# Patient Record
Sex: Female | Born: 1960 | Race: White | Hispanic: No | Smoking: Never smoker
Health system: Southern US, Community
[De-identification: ages and names within clinical notes are randomized; demographics above are authoritative.]

## PROBLEM LIST (undated history)

## (undated) HISTORY — PX: COLONOSCOPY: SHX174

---

## 1998-05-07 ENCOUNTER — Other Ambulatory Visit: Admission: RE | Admit: 1998-05-07 | Discharge: 1998-05-07 | Payer: Self-pay | Admitting: Obstetrics and Gynecology

## 1999-05-03 ENCOUNTER — Encounter (INDEPENDENT_AMBULATORY_CARE_PROVIDER_SITE_OTHER): Payer: Self-pay | Admitting: Specialist

## 1999-05-03 ENCOUNTER — Other Ambulatory Visit: Admission: RE | Admit: 1999-05-03 | Discharge: 1999-05-03 | Payer: Self-pay | Admitting: Otolaryngology

## 1999-12-26 ENCOUNTER — Other Ambulatory Visit: Admission: RE | Admit: 1999-12-26 | Discharge: 1999-12-26 | Payer: Self-pay | Admitting: Obstetrics and Gynecology

## 2001-07-30 ENCOUNTER — Other Ambulatory Visit: Admission: RE | Admit: 2001-07-30 | Discharge: 2001-07-30 | Payer: Self-pay | Admitting: Obstetrics and Gynecology

## 2002-04-04 ENCOUNTER — Other Ambulatory Visit: Admission: RE | Admit: 2002-04-04 | Discharge: 2002-04-04 | Payer: Self-pay | Admitting: Obstetrics and Gynecology

## 2002-09-15 ENCOUNTER — Other Ambulatory Visit: Admission: RE | Admit: 2002-09-15 | Discharge: 2002-09-15 | Payer: Self-pay | Admitting: Obstetrics and Gynecology

## 2002-10-14 ENCOUNTER — Encounter: Admission: RE | Admit: 2002-10-14 | Discharge: 2002-10-14 | Payer: Self-pay | Admitting: Internal Medicine

## 2002-10-14 ENCOUNTER — Encounter: Payer: Self-pay | Admitting: Internal Medicine

## 2003-06-02 ENCOUNTER — Other Ambulatory Visit: Admission: RE | Admit: 2003-06-02 | Discharge: 2003-06-02 | Payer: Self-pay | Admitting: Obstetrics and Gynecology

## 2004-09-02 ENCOUNTER — Other Ambulatory Visit: Admission: RE | Admit: 2004-09-02 | Discharge: 2004-09-02 | Payer: Self-pay | Admitting: Obstetrics and Gynecology

## 2004-10-10 ENCOUNTER — Ambulatory Visit: Payer: Self-pay | Admitting: Internal Medicine

## 2004-12-25 ENCOUNTER — Ambulatory Visit: Payer: Self-pay | Admitting: Internal Medicine

## 2005-08-24 ENCOUNTER — Emergency Department (HOSPITAL_COMMUNITY): Admission: EM | Admit: 2005-08-24 | Discharge: 2005-08-24 | Payer: Self-pay | Admitting: Emergency Medicine

## 2005-10-10 ENCOUNTER — Other Ambulatory Visit: Admission: RE | Admit: 2005-10-10 | Discharge: 2005-10-10 | Payer: Self-pay | Admitting: Obstetrics and Gynecology

## 2006-09-16 ENCOUNTER — Encounter: Admission: RE | Admit: 2006-09-16 | Discharge: 2006-09-16 | Payer: Self-pay | Admitting: Obstetrics and Gynecology

## 2007-12-18 ENCOUNTER — Ambulatory Visit: Payer: Self-pay | Admitting: Family Medicine

## 2007-12-18 DIAGNOSIS — L255 Unspecified contact dermatitis due to plants, except food: Secondary | ICD-10-CM

## 2007-12-22 ENCOUNTER — Telehealth (INDEPENDENT_AMBULATORY_CARE_PROVIDER_SITE_OTHER): Payer: Self-pay | Admitting: *Deleted

## 2008-01-17 ENCOUNTER — Encounter: Admission: RE | Admit: 2008-01-17 | Discharge: 2008-01-17 | Payer: Self-pay | Admitting: Obstetrics and Gynecology

## 2008-05-19 ENCOUNTER — Ambulatory Visit: Payer: Self-pay | Admitting: Family Medicine

## 2008-05-19 DIAGNOSIS — J069 Acute upper respiratory infection, unspecified: Secondary | ICD-10-CM | POA: Insufficient documentation

## 2008-05-29 ENCOUNTER — Telehealth (INDEPENDENT_AMBULATORY_CARE_PROVIDER_SITE_OTHER): Payer: Self-pay | Admitting: *Deleted

## 2008-05-30 ENCOUNTER — Telehealth (INDEPENDENT_AMBULATORY_CARE_PROVIDER_SITE_OTHER): Payer: Self-pay | Admitting: *Deleted

## 2008-12-20 ENCOUNTER — Ambulatory Visit: Payer: Self-pay | Admitting: Family Medicine

## 2009-01-22 ENCOUNTER — Encounter: Admission: RE | Admit: 2009-01-22 | Discharge: 2009-01-22 | Payer: Self-pay | Admitting: Obstetrics and Gynecology

## 2009-01-24 ENCOUNTER — Telehealth (INDEPENDENT_AMBULATORY_CARE_PROVIDER_SITE_OTHER): Payer: Self-pay | Admitting: *Deleted

## 2009-05-21 ENCOUNTER — Ambulatory Visit: Payer: Self-pay | Admitting: Internal Medicine

## 2009-05-21 DIAGNOSIS — N390 Urinary tract infection, site not specified: Secondary | ICD-10-CM

## 2009-05-21 DIAGNOSIS — R300 Dysuria: Secondary | ICD-10-CM | POA: Insufficient documentation

## 2009-05-21 LAB — CONVERTED CEMR LAB
Bilirubin Urine: NEGATIVE
Ketones, urine, test strip: NEGATIVE
Specific Gravity, Urine: 1.01
pH: 7

## 2009-05-22 ENCOUNTER — Encounter: Payer: Self-pay | Admitting: Internal Medicine

## 2009-05-22 LAB — CONVERTED CEMR LAB: Bacteria, UA: NONE SEEN

## 2009-11-20 ENCOUNTER — Ambulatory Visit: Payer: Self-pay | Admitting: Family Medicine

## 2009-11-20 DIAGNOSIS — T24239A Burn of second degree of unspecified lower leg, initial encounter: Secondary | ICD-10-CM

## 2009-11-27 ENCOUNTER — Ambulatory Visit: Payer: Self-pay | Admitting: Family Medicine

## 2009-11-27 ENCOUNTER — Encounter (HOSPITAL_BASED_OUTPATIENT_CLINIC_OR_DEPARTMENT_OTHER): Admission: RE | Admit: 2009-11-27 | Discharge: 2010-02-25 | Payer: Self-pay | Admitting: General Surgery
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
## 2010-12-10 NOTE — Assessment & Plan Note (Signed)
[facility]                                 ON-CALL NOTE   NAME:FAUCETTEArthelia, Charlene Wise                      MRN:          [id number]  DATE:12/18/2007                            DOB:          [birth year]    The patient has a bad rash, will be seen in [facility].   Primary care Araya Roel is Dr. Alwyn Ren and home office is [place].     Arta Silence, MD  Electronically Signed    RNS/MedQ  DD: 12/18/2007  DT: 12/19/2007  Job #: [id number]   cc:   Titus Dubin. Alwyn Ren, MD,FACP,FCCP

## 2011-08-29 ENCOUNTER — Ambulatory Visit (INDEPENDENT_AMBULATORY_CARE_PROVIDER_SITE_OTHER): Payer: BC Managed Care – PPO | Admitting: Family Medicine

## 2011-08-29 ENCOUNTER — Encounter: Payer: Self-pay | Admitting: Family Medicine

## 2011-08-29 DIAGNOSIS — H6120 Impacted cerumen, unspecified ear: Secondary | ICD-10-CM

## 2011-08-29 DIAGNOSIS — J302 Other seasonal allergic rhinitis: Secondary | ICD-10-CM

## 2011-08-29 DIAGNOSIS — J309 Allergic rhinitis, unspecified: Secondary | ICD-10-CM

## 2011-08-29 NOTE — Patient Instructions (Signed)
Schedule your complete physical at your convenience Your excessive wax production is due to untreated allergies Start Claritin or Zyrtec daily to decrease nasal congestion and post nasal drip Gargle w/ salt water to decrease post nasal drip Drink plenty of fluids Call with any questions or concerns Hang in there!

## 2011-08-29 NOTE — Assessment & Plan Note (Signed)
Ears successfully irrigated and pt's sxs immediately improved.

## 2011-08-29 NOTE — Progress Notes (Signed)
  Subjective:    Patient ID: Charlene Wise, female    DOB: 1961/02/05, 51 y.o.   MRN: [id number]  HPI Bilateral ear fullness- sxs started 2 days ago, progressively worsening.  Taking mucinex and sudafed.  R>L.  Decreased hearing.  No fevers, facial pain, sore throat.  + congestion, sneezing.  No cough.  No known sick contacts.   Review of Systems For ROS see HPI     Objective:   Physical Exam  Vitals reviewed. Constitutional: She appears well-developed and well-nourished. No distress.  HENT:  Head: Normocephalic and atraumatic.  Right Ear: Tympanic membrane normal.  Left Ear: Tympanic membrane normal.  Nose: Mucosal edema and rhinorrhea present. Right sinus exhibits no maxillary sinus tenderness and no frontal sinus tenderness. Left sinus exhibits no maxillary sinus tenderness and no frontal sinus tenderness.  Mouth/Throat: Mucous membranes are normal. Posterior oropharyngeal erythema (w/ PND) present.       TMs obscured by wax- easily irrigated.  sxs immediately improved.  Eyes: Conjunctivae and EOM are normal. Pupils are equal, round, and reactive to light.  Neck: Normal range of motion. Neck supple.  Cardiovascular: Normal rate, regular rhythm and normal heart sounds.   Pulmonary/Chest: Effort normal and breath sounds normal. No respiratory distress. She has no wheezes. She has no rales.  Lymphadenopathy:    She has no cervical adenopathy.          Assessment & Plan:

## 2011-08-29 NOTE — Assessment & Plan Note (Signed)
New.  Pt's PND and nasal congestion consistent w/ allergic process.  Start OTC antihistamine.  Reviewed supportive care and red flags that should prompt return.  Pt expressed understanding and is in agreement w/ plan.

## 2011-12-12 ENCOUNTER — Telehealth: Payer: Self-pay | Admitting: Family Medicine

## 2011-12-12 MED ORDER — PREDNISONE 20 MG PO TABS: ORAL_TABLET | ORAL | Status: DC

## 2011-12-12 NOTE — Telephone Encounter (Signed)
Called pt to advise the rx has been sent to her pharmacy, pt noted

## 2011-12-12 NOTE — Telephone Encounter (Signed)
Ok for prednisone 20mg - 2 tabs daily x10 days (take w/ food), #20
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
## 2013-08-17 ENCOUNTER — Ambulatory Visit (INDEPENDENT_AMBULATORY_CARE_PROVIDER_SITE_OTHER): Payer: BC Managed Care – PPO | Admitting: Family Medicine

## 2013-08-17 ENCOUNTER — Encounter: Payer: Self-pay | Admitting: General Practice

## 2013-08-17 VITALS — BP 132/86 | HR 88 | Temp 98.5°F | Resp 12 | Wt 167.2 lb

## 2013-08-17 DIAGNOSIS — R1031 Right lower quadrant pain: Secondary | ICD-10-CM | POA: Insufficient documentation

## 2013-08-17 DIAGNOSIS — R109 Unspecified abdominal pain: Secondary | ICD-10-CM

## 2013-08-17 LAB — CBC WITH DIFFERENTIAL/PLATELET
BASOS ABS: 0 10*3/uL (ref 0.0–0.1)
Basophils Relative: 0.3 % (ref 0.0–3.0)
EOS ABS: 0.3 10*3/uL (ref 0.0–0.7)
Eosinophils Relative: 5.1 % — ABNORMAL HIGH (ref 0.0–5.0)
HEMATOCRIT: 41.5 % (ref 36.0–46.0)
HEMOGLOBIN: 14.1 g/dL (ref 12.0–15.0)
LYMPHS ABS: 2.6 10*3/uL (ref 0.7–4.0)
Lymphocytes Relative: 39.7 % (ref 12.0–46.0)
MCHC: 34 g/dL (ref 30.0–36.0)
MCV: 93.7 fl (ref 78.0–100.0)
MONO ABS: 0.5 10*3/uL (ref 0.1–1.0)
Monocytes Relative: 7.4 % (ref 3.0–12.0)
NEUTROS ABS: 3.2 10*3/uL (ref 1.4–7.7)
Neutrophils Relative %: 47.5 % (ref 43.0–77.0)
Platelets: 317 10*3/uL (ref 150.0–400.0)
RBC: 4.43 Mil/uL (ref 3.87–5.11)
RDW: 12.1 % (ref 11.5–14.6)
WBC: 6.7 10*3/uL (ref 4.5–10.5)

## 2013-08-17 LAB — POCT URINALYSIS DIPSTICK
Bilirubin, UA: NEGATIVE
Blood, UA: NEGATIVE
GLUCOSE UA: NEGATIVE
Ketones, UA: NEGATIVE
Leukocytes, UA: NEGATIVE
Nitrite, UA: NEGATIVE
Protein, UA: NEGATIVE
UROBILINOGEN UA: 0.2
pH, UA: 6.5

## 2013-08-17 LAB — BASIC METABOLIC PANEL
BUN: 12 mg/dL (ref 6–23)
CALCIUM: 9.1 mg/dL (ref 8.4–10.5)
CO2: 28 meq/L (ref 19–32)
CREATININE: 0.7 mg/dL (ref 0.4–1.2)
Chloride: 107 mEq/L (ref 96–112)
GFR: 88.17 mL/min (ref >60.00)
Glucose, Bld: 92 mg/dL (ref 70–99)
Potassium: 4.6 mEq/L (ref 3.5–5.1)
SODIUM: 141 meq/L (ref 135–145)

## 2013-08-17 LAB — HEPATIC FUNCTION PANEL
ALT: 23 U/L (ref 0–35)
AST: 21 U/L (ref 0–37)
Albumin: 4.3 g/dL (ref 3.5–5.2)
Alkaline Phosphatase: 46 U/L (ref 39–117)
BILIRUBIN DIRECT: 0 mg/dL (ref 0.0–0.3)
BILIRUBIN TOTAL: 0.8 mg/dL (ref 0.3–1.2)
Total Protein: 7 g/dL (ref 6.0–8.3)

## 2013-08-17 LAB — HM MAMMOGRAPHY: HM MAMMO: NORMAL

## 2013-08-17 LAB — POCT URINE PREGNANCY: Preg Test, Ur: NEGATIVE

## 2013-08-17 NOTE — Progress Notes (Signed)
   Subjective:    Patient ID: Charlene Wise, female    DOB: 08/24/1960, 53 y.o.   MRN: [id number]  HPI abd pain- R sided abd pain, could be either low or high.  No relation to eating.  No nausea.  Pain is intermittent.  Described as 'dull'.  Had pain on Friday and 'was noticeable for most of the day- subsided some in the afternoon'.  No diarrhea.  No changes to BMs.  No fevers.  sxs first started over 6 months ago.  sxs temporarily improved w/ holistic supplements.  Have worsened again this past week.  At times will radiate to mid back.  Husband died of appendiceal cancer.  Pt is due to have IUD pulled- 'could i be pregnant?'   Review of Systems For ROS see HPI     Objective:   Physical Exam  Vitals reviewed. Constitutional: She is oriented to person, place, and time. She appears well-developed and well-nourished. No distress.  Cardiovascular: Normal rate, regular rhythm and normal heart sounds.   Pulmonary/Chest: Effort normal and breath sounds normal. No respiratory distress. She has no wheezes. She has no rales.  Abdominal: Soft. Bowel sounds are normal. She exhibits no distension (mild RLQ pain). There is tenderness. There is no rebound and no guarding.  Neurological: She is alert and oriented to person, place, and time. No cranial nerve deficit.  Psychiatric: She has a normal mood and affect. Her behavior is normal. Thought content normal.          Assessment & Plan:

## 2013-08-17 NOTE — Patient Instructions (Signed)
If no improvement or worsening at any time- call me and we'll proceed w/ CT scan We'll notify you of your lab results and make any changes if needed Start the Align daily Increase your water intake Call with any questions or concerns Hang in there!

## 2013-08-17 NOTE — Assessment & Plan Note (Signed)
New.  Differential is broad- UTI (normal UA pretty much rules this out), gas, IBS, appendicitis (unlikely given long time course, lack of fever, and mild degree of discomfort), ovulatory pain given time to have IUD removed.  Check labs.  Start [employer].  If no improvement, get CT abd/pelvis.  Reviewed supportive care and red flags that should prompt return.  Pt expressed understanding and is in agreement w/ plan.

## 2013-08-17 NOTE — Progress Notes (Signed)
Pre visit review using our clinic review tool, if applicable. No additional management support is needed unless otherwise documented below in the visit note. 

## 2013-10-26 LAB — HM DEXA SCAN: HM Dexa Scan: BORDERLINE

## 2013-10-27 ENCOUNTER — Encounter: Payer: Self-pay | Admitting: Family Medicine

## 2013-10-27 ENCOUNTER — Ambulatory Visit (INDEPENDENT_AMBULATORY_CARE_PROVIDER_SITE_OTHER): Payer: BC Managed Care – PPO | Admitting: Family Medicine

## 2013-10-27 VITALS — BP 122/64 | HR 72 | Temp 98.6°F | Resp 16 | Wt 157.0 lb

## 2013-10-27 DIAGNOSIS — J019 Acute sinusitis, unspecified: Secondary | ICD-10-CM

## 2013-10-27 MED ORDER — AMOXICILLIN 875 MG PO TABS: 875.0000 mg | ORAL_TABLET | Freq: Two times a day (BID) | ORAL | Status: DC

## 2013-10-27 NOTE — Patient Instructions (Signed)
Follow up as needed Start the Amoxicillin twice daily- take w/ food Drink plenty of fluids REST! Vocal rest, ibuprofen for the laryngitis Claritin or Zyrtec for allergy component Call with any questions or concerns Congrats on the upcoming wedding!!

## 2013-10-27 NOTE — Assessment & Plan Note (Signed)
Pt's sxs and PE consistent w/ infxn.  Start abx.  Reviewed supportive care and red flags that should prompt return.  Pt expressed understanding and is in agreement w/ plan.  

## 2013-10-27 NOTE — Progress Notes (Signed)
   Subjective:    Patient ID: Charlene Wise, female    DOB: 10/28/1960, 53 y.o.   MRN: [id number]  Headache   Sinusitis Associated symptoms include headaches.  Sore Throat  Associated symptoms include headaches.   URI- sxs started Saturday w/ sore throat.  + facial pain/pressure.  Bilateral ear pain.  Minimal cough.  + laryngitis.  No N/V/D.  Low grade temp Saturday night.  + sick contacts.   Review of Systems  Neurological: Positive for headaches.   For ROS see HPI     Objective:   Physical Exam  Vitals reviewed. Constitutional: She appears well-developed and well-nourished. No distress.  HENT:  Head: Normocephalic and atraumatic.  Right Ear: Tympanic membrane normal.  Left Ear: Tympanic membrane normal.  Nose: Mucosal edema and rhinorrhea present. Right sinus exhibits maxillary sinus tenderness and frontal sinus tenderness. Left sinus exhibits maxillary sinus tenderness and frontal sinus tenderness.  Mouth/Throat: Uvula is midline and mucous membranes are normal. Posterior oropharyngeal erythema present. No oropharyngeal exudate.  Eyes: Conjunctivae and EOM are normal. Pupils are equal, round, and reactive to light.  Neck: Normal range of motion. Neck supple.  Cardiovascular: Normal rate, regular rhythm and normal heart sounds.   Pulmonary/Chest: Effort normal and breath sounds normal. No respiratory distress. She has no wheezes.  Lymphadenopathy:    She has no cervical adenopathy.          Assessment & Plan:

## 2013-10-27 NOTE — Progress Notes (Signed)
Pre visit review using our clinic review tool, if applicable. No additional management support is needed unless otherwise documented below in the visit note. 

## 2013-11-28 ENCOUNTER — Ambulatory Visit (INDEPENDENT_AMBULATORY_CARE_PROVIDER_SITE_OTHER): Payer: BC Managed Care – PPO | Admitting: Physician Assistant

## 2013-11-28 ENCOUNTER — Encounter: Payer: Self-pay | Admitting: Physician Assistant

## 2013-11-28 VITALS — BP 117/73 | HR 88 | Temp 98.4°F | Resp 16 | Ht 66.0 in | Wt 161.5 lb

## 2013-11-28 DIAGNOSIS — H6690 Otitis media, unspecified, unspecified ear: Secondary | ICD-10-CM

## 2013-11-28 DIAGNOSIS — J329 Chronic sinusitis, unspecified: Secondary | ICD-10-CM

## 2013-11-28 MED ORDER — AMOXICILLIN-POT CLAVULANATE 875-125 MG PO TABS: 1.0000 | ORAL_TABLET | Freq: Two times a day (BID) | ORAL | Status: DC

## 2013-11-28 NOTE — Patient Instructions (Signed)
Please take antibiotic as directed.  Increase fluid intake.  Use Saline nasal spray.  Take a daily multivitamin. Can use Sudafed.  Continue allergy medications.  Place a humidifier in the bedroom.  Please call or return clinic if symptoms are not improving.  Sinusitis Sinusitis is redness, soreness, and swelling (inflammation) of the paranasal sinuses. Paranasal sinuses are air pockets within the bones of your face (beneath the eyes, the middle of the forehead, or above the eyes). In healthy paranasal sinuses, mucus is able to drain out, and air is able to circulate through them by way of your nose. However, when your paranasal sinuses are inflamed, mucus and air can become trapped. This can allow bacteria and other germs to grow and cause infection. Sinusitis can develop quickly and last only a short time (acute) or continue over a long period (chronic). Sinusitis that lasts for more than 12 weeks is considered chronic.  CAUSES  Causes of sinusitis include:  Allergies.  Structural abnormalities, such as displacement of the cartilage that separates your nostrils (deviated septum), which can decrease the air flow through your nose and sinuses and affect sinus drainage.  Functional abnormalities, such as when the small hairs (cilia) that line your sinuses and help remove mucus do not work properly or are not present. SYMPTOMS  Symptoms of acute and chronic sinusitis are the same. The primary symptoms are pain and pressure around the affected sinuses. Other symptoms include:  Upper toothache.  Earache.  Headache.  Bad breath.  Decreased sense of smell and taste.  A cough, which worsens when you are lying flat.  Fatigue.  Fever.  Thick drainage from your nose, which often is green and may contain pus (purulent).  Swelling and warmth over the affected sinuses. DIAGNOSIS  Your caregiver will perform a physical exam. During the exam, your caregiver may:  Look in your nose for signs of  abnormal growths in your nostrils (nasal polyps).  Tap over the affected sinus to check for signs of infection.  View the inside of your sinuses (endoscopy) with a special imaging device with a light attached (endoscope), which is inserted into your sinuses. If your caregiver suspects that you have chronic sinusitis, one or more of the following tests may be recommended:  Allergy tests.  Nasal culture A sample of mucus is taken from your nose and sent to a lab and screened for bacteria.  Nasal cytology A sample of mucus is taken from your nose and examined by your caregiver to determine if your sinusitis is related to an allergy. TREATMENT  Most cases of acute sinusitis are related to a viral infection and will resolve on their own within 10 days. Sometimes medicines are prescribed to help relieve symptoms (pain medicine, decongestants, nasal steroid sprays, or saline sprays).  However, for sinusitis related to a bacterial infection, your caregiver will prescribe antibiotic medicines. These are medicines that will help kill the bacteria causing the infection.  Rarely, sinusitis is caused by a fungal infection. In theses cases, your caregiver will prescribe antifungal medicine. For some cases of chronic sinusitis, surgery is needed. Generally, these are cases in which sinusitis recurs more than 3 times per year, despite other treatments. HOME CARE INSTRUCTIONS   Drink plenty of water. Water helps thin the mucus so your sinuses can drain more easily.  Use a humidifier.  Inhale steam 3 to 4 times a day (for example, sit in the bathroom with the shower running).  Apply a warm, moist washcloth to your  face 3 to 4 times a day, or as directed by your caregiver.  Use saline nasal sprays to help moisten and clean your sinuses.  Take over-the-counter or prescription medicines for pain, discomfort, or fever only as directed by your caregiver. SEEK IMMEDIATE MEDICAL CARE IF:  You have increasing  pain or severe headaches.  You have nausea, vomiting, or drowsiness.  You have swelling around your face.  You have vision problems.  You have a stiff neck.  You have difficulty breathing. MAKE SURE YOU:   Understand these instructions.  Will watch your condition.  Will get help right away if you are not doing well or get worse. Document Released: 07/14/2005 Document Revised: 10/06/2011 Document Reviewed: 07/29/2011 [facility] Patient Information 2014 [place], [place].

## 2013-11-28 NOTE — Progress Notes (Signed)
Pre visit review using our clinic review tool, if applicable. No additional management support is needed unless otherwise documented below in the visit note/SLS  

## 2013-11-29 DIAGNOSIS — H6690 Otitis media, unspecified, unspecified ear: Secondary | ICD-10-CM | POA: Insufficient documentation

## 2013-11-29 DIAGNOSIS — J329 Chronic sinusitis, unspecified: Secondary | ICD-10-CM | POA: Insufficient documentation

## 2013-11-29 NOTE — Assessment & Plan Note (Signed)
With sinusitis. Rx Augmentin. See assessment and plan for sinusitis.

## 2013-11-29 NOTE — Progress Notes (Signed)
Patient presents to clinic today c/o one week of sinus pressure, sinus pain, cough, postnasal drip, bilateral ear pain and fatigue. Patient denies shortness of breath, wheezing or pleuritic chest pain. Denies recent travel or sick contact. Patient states she is prone to sinus infections. Patient also with history of allergies, for which he takes it daily Claritin and uses Nasonex nasal spray.  No past medical history on file.  Current Outpatient Prescriptions on File Prior to Visit  Medication Sig Dispense Refill  . ALPRAZolam (XANAX) 0.25 MG tablet Take 0.25 mg by mouth daily.      Marland Kitchen. doxylamine, Sleep, (UNISOM) 25 MG tablet Take 25 mg by mouth at bedtime as needed.      . loratadine (CLARITIN) 10 MG tablet Take 10 mg by mouth daily.      . mometasone (NASONEX) 50 MCG/ACT nasal spray Place 1 spray into the nose daily. Each nostril      . NON FORMULARY Take 2 tablets by mouth daily. Basic B Complex      . NON FORMULARY Take 1 capsule by mouth daily. Thyroxal      . NON FORMULARY Take 1 tablet by mouth daily. Neuherin      . NON FORMULARY Take 4 tablets by mouth daily. Livit 2      . NON FORMULARY Take 1 tablet by mouth daily. GB plus       No current facility-administered medications on file prior to visit.    No Known Allergies  No family history on file.  History   Social History  . Marital Status: Widowed    Spouse Name: N/A    Number of Children: N/A  . Years of Education: N/A   Social History Main Topics  . Smoking status: Never Smoker   . Smokeless tobacco: None  . Alcohol Use: Yes     Comment: 2-3 times weekly  . Drug Use: No  . Sexual Activity: None   Other Topics Concern  . None   Social History Narrative  . None   Review of Systems - See HPI.  All other ROS are negative.  BP 117/73  Pulse 88  Temp(Src) 98.4 F (36.9 C) (Oral)  Resp 16  Ht 5\' 6"  (1.676 m)  Wt 161 lb 8 oz (73.256 kg)  BMI 26.08 kg/m2  SpO2 98%  Physical Exam  Constitutional: She is  oriented to person, place, and time and well-developed, well-nourished, and in no distress.  HENT:  Head: Normocephalic and atraumatic.  Right Ear: External ear normal.  Left Ear: External ear normal.  Nose: Nose normal.  Mouth/Throat: Oropharynx is clear and moist. No oropharyngeal exudate.  L TM erythematous and bugling.  R TM bulging and cloudy without erythema.  + TTP of sinuses noted on examination.  Eyes: Conjunctivae are normal. Pupils are equal, round, and reactive to light.  Neck: Neck supple.  Cardiovascular: Normal rate, regular rhythm, normal heart sounds and intact distal pulses.   Pulmonary/Chest: Effort normal and breath sounds normal. No respiratory distress. She has no wheezes. She has no rales. She exhibits no tenderness.  Lymphadenopathy:    She has no cervical adenopathy.  Neurological: She is alert and oriented to person, place, and time.  Skin: Skin is warm and dry. No rash noted.  Psychiatric: Affect normal.    No results found for this or any previous visit (from the past 2160 hour(s)).  Assessment/Plan: AOM (acute otitis media) With sinusitis. Rx Augmentin. See assessment and plan for sinusitis.  Sinusitis Rx Augmentin. Increase fluid intake. Rest. Saline nasal spray. Continue allergy medications. Mucinex. Place a humidifier in bedroom. Daily probiotic. Call or return to clinic if symptoms aren't improving.

## 2013-11-29 NOTE — Assessment & Plan Note (Signed)
Rx Augmentin. Increase fluid intake. Rest. Saline nasal spray. Continue allergy medications. Mucinex. Place a humidifier in bedroom. Daily probiotic. Call or return to clinic if symptoms aren't improving.

## 2013-12-15 ENCOUNTER — Other Ambulatory Visit: Payer: Self-pay | Admitting: Obstetrics and Gynecology

## 2014-06-27 LAB — HM PAP SMEAR: HM Pap smear: NORMAL

## 2014-08-17 ENCOUNTER — Encounter: Payer: Self-pay | Admitting: Family Medicine

## 2014-08-17 ENCOUNTER — Ambulatory Visit (INDEPENDENT_AMBULATORY_CARE_PROVIDER_SITE_OTHER): Payer: BLUE CROSS/BLUE SHIELD | Admitting: Family Medicine

## 2014-08-17 VITALS — BP 122/80 | HR 82 | Temp 98.4°F | Resp 16 | Wt 169.0 lb

## 2014-08-17 DIAGNOSIS — R208 Other disturbances of skin sensation: Secondary | ICD-10-CM

## 2014-08-17 DIAGNOSIS — R200 Anesthesia of skin: Secondary | ICD-10-CM

## 2014-08-17 LAB — CBC WITH DIFFERENTIAL/PLATELET
BASOS PCT: 0.6 % (ref 0.0–3.0)
Basophils Absolute: 0 10*3/uL (ref 0.0–0.1)
Eosinophils Absolute: 0.3 10*3/uL (ref 0.0–0.7)
Eosinophils Relative: 4.3 % (ref 0.0–5.0)
HCT: 40.9 % (ref 36.0–46.0)
HEMOGLOBIN: 13.8 g/dL (ref 12.0–15.0)
LYMPHS ABS: 2.6 10*3/uL (ref 0.7–4.0)
Lymphocytes Relative: 37.2 % (ref 12.0–46.0)
MCHC: 33.6 g/dL (ref 30.0–36.0)
MCV: 92.6 fl (ref 78.0–100.0)
MONOS PCT: 5.9 % (ref 3.0–12.0)
Monocytes Absolute: 0.4 10*3/uL (ref 0.1–1.0)
NEUTROS ABS: 3.6 10*3/uL (ref 1.4–7.7)
Neutrophils Relative %: 52 % (ref 43.0–77.0)
Platelets: 327 10*3/uL (ref 150.0–400.0)
RBC: 4.42 Mil/uL (ref 3.87–5.11)
RDW: 12.2 % (ref 11.5–15.5)
WBC: 6.9 10*3/uL (ref 4.0–10.5)

## 2014-08-17 LAB — BASIC METABOLIC PANEL
BUN: 14 mg/dL (ref 6–23)
CO2: 26 mEq/L (ref 19–32)
CREATININE: 0.9 mg/dL (ref 0.40–1.20)
Calcium: 9.1 mg/dL (ref 8.4–10.5)
Chloride: 106 mEq/L (ref 96–112)
GFR: 68.99 mL/min (ref >60.00)
Glucose, Bld: 103 mg/dL — ABNORMAL HIGH (ref 70–99)
Potassium: 3.9 mEq/L (ref 3.5–5.1)
Sodium: 141 mEq/L (ref 135–145)

## 2014-08-17 LAB — TSH: TSH: 1.22 u[IU]/mL (ref 0.35–4.50)

## 2014-08-17 LAB — B12 AND FOLATE PANEL
FOLATE: 21.1 ng/mL (ref >5.9)
VITAMIN B 12: 235 pg/mL (ref 211–911)

## 2014-08-17 NOTE — Progress Notes (Signed)
   Subjective:    Patient ID: Charlene Wise, female    DOB: 10/27/1960, 54 y.o.   MRN: [id number]  HPI Hand numbness- bilateral.  sxs initially started as itching but then progressed to tingling.  Now fingers are going numb.  Was waking her up at night.  Started sleeping in wrist splints w/ some improvement but sxs return when she removes the braces.  Pt types daily, 'all day'.   Review of Systems For ROS see HPI     Objective:   Physical Exam  Constitutional: She is oriented to person, place, and time. She appears well-developed and well-nourished. No distress.  Cardiovascular: Intact distal pulses.   Neurological: She is alert and oriented to person, place, and time. She has normal reflexes. No cranial nerve deficit. Coordination normal.  + phalen's bilaterally  Vitals reviewed.         Assessment & Plan:

## 2014-08-17 NOTE — Patient Instructions (Signed)
Schedule your complete physical at your convenience We'll notify you of your lab results (although I expect them to be normal) We'll call with you with your hand specialist appt Continue to wear the wrist braces as you are able ICE!!! Call with any questions or concerns Stay safe and warm!!!

## 2014-08-17 NOTE — Progress Notes (Signed)
Pre visit review using our clinic review tool, if applicable. No additional management support is needed unless otherwise documented below in the visit note. 

## 2014-08-19 NOTE — Assessment & Plan Note (Signed)
New.  Pt's sxs and + phalen's sign consistent w/ carpal tunnel but will check labs to r/o other cause of neuropathy/numbness/tingling.  Refer to hand specialist.  Pt expressed understanding and is in agreement w/ plan.

## 2014-08-21 ENCOUNTER — Encounter: Payer: Self-pay | Admitting: General Practice

## 2014-08-31 ENCOUNTER — Telehealth: Payer: Self-pay | Admitting: Family Medicine

## 2014-08-31 DIAGNOSIS — G5603 Carpal tunnel syndrome, bilateral upper limbs: Secondary | ICD-10-CM

## 2014-08-31 NOTE — Telephone Encounter (Signed)
Caller name: Rene KocherRegina Relation to pt: self Call back number:  [phone] Pharmacy:  Reason for call:   Patient is requesting an rx for bilateral hand and finger orthosis. Rx is supposed to say for Carpal tunnel syndrome. Needs Dr. Beverely Lowabori signature.  Patient is requesting this this be mailed to her or she can pick rx up.

## 2014-08-31 NOTE — Telephone Encounter (Signed)
Ok to provide DME script for bilateral hand and finger orthosis dx: bilateral carpal tunnel

## 2014-09-01 MED ORDER — NONFORMULARY OR COMPOUNDED ITEM: Status: DC

## 2014-09-01 NOTE — Telephone Encounter (Signed)
DME written and pt notified.

## 2014-09-01 NOTE — Addendum Note (Signed)
Addended by: Lorene DyYLER, JESSICA L on: 09/01/2014 10:14 AM   Modules accepted: Orders

## 2014-09-01 NOTE — Telephone Encounter (Signed)
Mailed to pt

## 2014-12-07 ENCOUNTER — Other Ambulatory Visit: Payer: Self-pay | Admitting: Obstetrics and Gynecology

## 2014-12-08 LAB — CYTOLOGY - PAP

## 2014-12-26 ENCOUNTER — Ambulatory Visit (INDEPENDENT_AMBULATORY_CARE_PROVIDER_SITE_OTHER): Payer: BLUE CROSS/BLUE SHIELD | Admitting: Internal Medicine

## 2014-12-26 ENCOUNTER — Encounter: Payer: Self-pay | Admitting: Internal Medicine

## 2014-12-26 ENCOUNTER — Other Ambulatory Visit: Payer: Self-pay | Admitting: Internal Medicine

## 2014-12-26 ENCOUNTER — Ambulatory Visit (INDEPENDENT_AMBULATORY_CARE_PROVIDER_SITE_OTHER)
Admission: RE | Admit: 2014-12-26 | Discharge: 2014-12-26 | Disposition: A | Discharge status: Home / Self Care | Payer: BLUE CROSS/BLUE SHIELD | Source: Ambulatory Visit | Attending: Internal Medicine | Admitting: Internal Medicine

## 2014-12-26 ENCOUNTER — Other Ambulatory Visit (INDEPENDENT_AMBULATORY_CARE_PROVIDER_SITE_OTHER): Payer: BLUE CROSS/BLUE SHIELD

## 2014-12-26 VITALS — BP 138/74 | HR 67 | Temp 98.2°F | Resp 15 | Wt 148.0 lb

## 2014-12-26 DIAGNOSIS — M546 Pain in thoracic spine: Secondary | ICD-10-CM

## 2014-12-26 DIAGNOSIS — R1011 Right upper quadrant pain: Secondary | ICD-10-CM

## 2014-12-26 DIAGNOSIS — R1031 Right lower quadrant pain: Secondary | ICD-10-CM

## 2014-12-26 LAB — CBC WITH DIFFERENTIAL/PLATELET
BASOS ABS: 0.1 10*3/uL (ref 0.0–0.1)
Basophils Relative: 0.6 % (ref 0.0–3.0)
EOS PCT: 5.5 % — AB (ref 0.0–5.0)
Eosinophils Absolute: 0.5 10*3/uL (ref 0.0–0.7)
HCT: 42.4 % (ref 36.0–46.0)
Hemoglobin: 14.1 g/dL (ref 12.0–15.0)
LYMPHS ABS: 3.1 10*3/uL (ref 0.7–4.0)
LYMPHS PCT: 34.4 % (ref 12.0–46.0)
MCHC: 33.3 g/dL (ref 30.0–36.0)
MCV: 93.9 fl (ref 78.0–100.0)
MONO ABS: 0.5 10*3/uL (ref 0.1–1.0)
Monocytes Relative: 5.7 % (ref 3.0–12.0)
Neutro Abs: 4.8 10*3/uL (ref 1.4–7.7)
Neutrophils Relative %: 53.8 % (ref 43.0–77.0)
Platelets: 269 10*3/uL (ref 150.0–400.0)
RBC: 4.52 Mil/uL (ref 3.87–5.11)
RDW: 13.5 % (ref 11.5–15.5)
WBC: 8.9 10*3/uL (ref 4.0–10.5)

## 2014-12-26 MED ORDER — HYDROCODONE-ACETAMINOPHEN 10-325 MG PO TABS
1.0000 | ORAL_TABLET | ORAL | Status: DC | PRN
Start: 2014-12-26 — End: 2015-05-16

## 2014-12-26 NOTE — Progress Notes (Signed)
Pre visit review using our clinic review tool, if applicable. No additional management support is needed unless otherwise documented below in the visit note. 

## 2014-12-26 NOTE — Progress Notes (Signed)
   Subjective:    Patient ID: Charlene Wise, female    DOB: 08/09/1960, 54 y.o.   MRN: [id number]  HPI Today she developed midthoracic pain which radiated to the abdomen diffusely on the right. It was described as sharp and constant. This was associated with nausea. She treated this with heat without response.   She's had midthoracic pain for 2 months described as dull and aching. It had been localized and nonradiating and worse in the afternoons.  She has no genitourinary, GI, cardiovascular associated symptoms.  Past medical history includes epidural. She had a colonoscopy at age 54. She's had no follow-up    Review of Systems Dysuria, pyuria, hematuria, frequency, nocturia or polyuria are denied.   There is no significant cough, sputum production,hemoptysis, wheezing,or  paroxysmal nocturnal dyspnea.  Unexplained weight loss, significant dyspepsia, dysphagia, melena, rectal bleeding, or persistently small caliber stools are not present.       Objective:   Physical Exam Pertinent or positive findings include: She is markedly  tender to percussion over the midthoracic spine.  She is also tender to deep palpation in the right upper quadrant as well as the right lower quadrant.  General appearance is one of good health and nourishment w/o distress. Eyes: No conjunctival inflammation or scleral icterus is present. Oral exam: Dental hygiene is good; lips and gums are healthy appearing.There is no oropharyngeal erythema or exudate noted.  Heart:  Normal rate and regular rhythm. S1 and S2 normal without gallop, murmur, click, rub or other extra sounds   Lungs:Chest clear to auscultation; no wheezes, rhonchi,rales ,or rubs present.No increased work of breathing.  Abdomen: bowel sounds normal, soft and non-tender without masses, organomegaly or hernias noted.  No guarding or rebound .  Musculoskeletal: Able to lie flat and sit up without help. Negative straight leg raising bilaterally.  Gait normal Skin:Warm & dry.  Intact without suspicious lesions or rashes ; no jaundice or tenting Lymphatic: No lymphadenopathy is noted about the head, neck, axilla             Assessment & Plan:  #1 thoracic spine pain #2 RUQ & RLQ abdominal pain See orders & AVS

## 2014-12-26 NOTE — Patient Instructions (Signed)
Stay on clear liquids for 48-72 hours or until bowels are normal.This would include  jello, sherbert (NOT ice cream), Lipton's chicken noodle soup(NOT cream based soups),Gatorade Lite, flat Ginger ale (without High Fructose Corn Syrup),dry toast or crackers, baked potato.  To ER if pain persists or is associaled with Warning Signs as discussed; specifically increasing pain, fever, or rectal bleeding.

## 2014-12-27 LAB — HEPATIC FUNCTION PANEL
ALT: 22 U/L (ref 0–35)
AST: 18 U/L (ref 0–37)
Albumin: 4.8 g/dL (ref 3.5–5.2)
Alkaline Phosphatase: 59 U/L (ref 39–117)
Bilirubin, Direct: 0.1 mg/dL (ref 0.0–0.3)
Total Bilirubin: 0.8 mg/dL (ref 0.2–1.2)
Total Protein: 7 g/dL (ref 6.0–8.3)

## 2014-12-27 LAB — BASIC METABOLIC PANEL
BUN: 18 mg/dL (ref 6–23)
CO2: 30 meq/L (ref 19–32)
CREATININE: 0.76 mg/dL (ref 0.40–1.20)
Calcium: 9.6 mg/dL (ref 8.4–10.5)
Chloride: 105 mEq/L (ref 96–112)
GFR: 83.75 mL/min (ref >60.00)
Glucose, Bld: 93 mg/dL (ref 70–99)
Potassium: 4.7 mEq/L (ref 3.5–5.1)
SODIUM: 142 meq/L (ref 135–145)

## 2014-12-28 LAB — URINE CULTURE: Colony Count: 9000

## 2015-05-16 ENCOUNTER — Ambulatory Visit (INDEPENDENT_AMBULATORY_CARE_PROVIDER_SITE_OTHER): Payer: BLUE CROSS/BLUE SHIELD | Admitting: Family Medicine

## 2015-05-16 ENCOUNTER — Telehealth: Payer: Self-pay | Admitting: Family Medicine

## 2015-05-16 ENCOUNTER — Encounter: Payer: Self-pay | Admitting: Family Medicine

## 2015-05-16 ENCOUNTER — Ambulatory Visit: Payer: BLUE CROSS/BLUE SHIELD | Admitting: Family Medicine

## 2015-05-16 VITALS — BP 126/78 | HR 67 | Temp 98.5°F | Resp 16 | Ht 66.0 in | Wt 148.2 lb

## 2015-05-16 DIAGNOSIS — J0100 Acute maxillary sinusitis, unspecified: Secondary | ICD-10-CM | POA: Diagnosis not present

## 2015-05-16 MED ORDER — AMOXICILLIN 875 MG PO TABS: 875.0000 mg | ORAL_TABLET | Freq: Two times a day (BID) | ORAL | Status: DC

## 2015-05-16 NOTE — Progress Notes (Signed)
   Subjective:    Patient ID: Charlene Wise, female    DOB: 02/26/1961, 54 y.o.   MRN: [id number]  HPI URI- 'my ears are hurting and my throat is hurting'.  sxs started ~1 week ago.  + maxillary sinus pressure.  No fevers.  + sick contacts.  No cough.  No N/V.  'terrible taste in my mouth'.   Review of Systems For ROS see HPI     Objective:   Physical Exam  Constitutional: She appears well-developed and well-nourished. No distress.  HENT:  Head: Normocephalic and atraumatic.  Right Ear: Tympanic membrane normal.  Left Ear: Tympanic membrane normal.  Nose: Mucosal edema and rhinorrhea present. Right sinus exhibits maxillary sinus tenderness. Right sinus exhibits no frontal sinus tenderness. Left sinus exhibits maxillary sinus tenderness. Left sinus exhibits no frontal sinus tenderness.  Mouth/Throat: Uvula is midline and mucous membranes are normal. Posterior oropharyngeal erythema present. No oropharyngeal exudate.  Eyes: Conjunctivae and EOM are normal. Pupils are equal, round, and reactive to light.  Neck: Normal range of motion. Neck supple.  Cardiovascular: Normal rate, regular rhythm and normal heart sounds.   Pulmonary/Chest: Effort normal and breath sounds normal. No respiratory distress. She has no wheezes.  Lymphadenopathy:    She has no cervical adenopathy.  Vitals reviewed.         Assessment & Plan:

## 2015-05-16 NOTE — Progress Notes (Signed)
Pre visit review using our clinic review tool, if applicable. No additional management support is needed unless otherwise documented below in the visit note. 

## 2015-05-16 NOTE — Telephone Encounter (Signed)
Pt was stuck in traffic and late for 9:45am appt 05/16/15, per Jess put her in for 11:00am same day, charge for being late/no show?

## 2015-05-16 NOTE — Assessment & Plan Note (Signed)
Pt's sxs and PE consistent w/ infxn.  Start abx.  Reviewed supportive care and red flags that should prompt return.  Pt expressed understanding and is in agreement w/ plan.  

## 2015-05-16 NOTE — Telephone Encounter (Signed)
No charge- traffic is not her fault and she was considerate enough to call

## 2015-05-16 NOTE — Patient Instructions (Signed)
Follow up as needed Start the Amoxicillin twice daily- take w/ food Drink plenty of fluids Take daily Claritin or Zyrtec to improve congestion and drainage REST! Call with any questions or concerns Hang in there!!!

## 2017-12-15 ENCOUNTER — Encounter: Payer: Self-pay | Admitting: General Practice

## 2019-05-30 ENCOUNTER — Telehealth: Payer: Self-pay

## 2019-05-30 NOTE — Telephone Encounter (Signed)
NOTES ON FILE FROM MICHELLE GREWAL [phone] SENT REFERRAL TO SCHEDULING 

## 2019-06-20 ENCOUNTER — Ambulatory Visit: Payer: BLUE CROSS/BLUE SHIELD | Admitting: Cardiology

## 2019-07-05 ENCOUNTER — Ambulatory Visit: Payer: BC Managed Care – PPO | Admitting: Cardiovascular Disease

## 2019-08-16 ENCOUNTER — Ambulatory Visit: Payer: BC Managed Care – PPO | Admitting: Cardiovascular Disease

## 2019-09-02 ENCOUNTER — Ambulatory Visit: Payer: BC Managed Care – PPO | Admitting: Internal Medicine

## 2019-09-07 ENCOUNTER — Telehealth: Payer: Self-pay | Admitting: Internal Medicine

## 2019-09-07 NOTE — Telephone Encounter (Signed)
Patient has new lipid consult scheduled 10/20/19 @ 0830 for only 15 mins. Changed to 0815 with 30 mins allotted. LM for patient to call back to confirm this is OK.

## 2019-10-20 ENCOUNTER — Ambulatory Visit: Payer: BC Managed Care – PPO | Admitting: Internal Medicine

## 2020-07-26 ENCOUNTER — Telehealth: Payer: Self-pay

## 2020-07-26 NOTE — Telephone Encounter (Signed)
NOTES ON FILE FROM DR MICHELLE GREWAL [phone], SENT REFERRAL TO SCHEDULING 

## 2020-10-17 ENCOUNTER — Telehealth: Payer: Self-pay

## 2020-10-17 NOTE — Telephone Encounter (Signed)
FAXED NOTES TO [facility] 

## 2020-11-02 ENCOUNTER — Encounter: Payer: Self-pay | Admitting: Internal Medicine

## 2020-11-02 ENCOUNTER — Ambulatory Visit (INDEPENDENT_AMBULATORY_CARE_PROVIDER_SITE_OTHER): Payer: 59 | Admitting: Internal Medicine

## 2020-11-02 ENCOUNTER — Other Ambulatory Visit: Payer: Self-pay

## 2020-11-02 VITALS — BP 162/94 | HR 82 | Ht 66.0 in | Wt 183.2 lb

## 2020-11-02 DIAGNOSIS — E7800 Pure hypercholesterolemia, unspecified: Secondary | ICD-10-CM | POA: Diagnosis not present

## 2020-11-02 NOTE — Patient Instructions (Signed)
Medication Instructions:  No changes  *If you need a refill on your cardiac medications before your next appointment, please call your pharmacy*   Lab Work: Fasting labs before your next appointment.   If you have labs (blood work) drawn today and your tests are completely normal, you will receive your results only by: Marland Kitchen MyChart Message (if you have MyChart) OR . A paper copy in the mail If you have any lab test that is abnormal or we need to change your treatment, we will call you to review the results.   Testing/Procedures: Dr. Rennis Golden has ordered a CT coronary calcium score. This test is done at [address]. This is $99 out of pocket.   Coronary CalciumScan A coronary calcium scan is an imaging test used to look for deposits of calcium and other fatty materials (plaques) in the inner lining of the blood vessels of the heart (coronary arteries). These deposits of calcium and plaques can partly clog and narrow the coronary arteries without producing any symptoms or warning signs. This puts a person at risk for a heart attack. This test can detect these deposits before symptoms develop. Tell a health care provider about:  Any allergies you have.  All medicines you are taking, including vitamins, herbs, eye drops, creams, and over-the-counter medicines.  Any problems you or family members have had with anesthetic medicines.  Any blood disorders you have.  Any surgeries you have had.  Any medical conditions you have.  Whether you are pregnant or may be pregnant. What are the risks? Generally, this is a safe procedure. However, problems may occur, including:  Harm to a pregnant woman and her unborn baby. This test involves the use of radiation. Radiation exposure can be dangerous to a pregnant woman and her unborn baby. If you are pregnant, you generally should not have this procedure done.  Slight increase in the risk of cancer. This is because of the  radiation involved in the test. What happens before the procedure? No preparation is needed for this procedure. What happens during the procedure?  You will undress and remove any jewelry around your neck or chest.  You will put on a hospital gown.  Sticky electrodes will be placed on your chest. The electrodes will be connected to an electrocardiogram (ECG) machine to record a tracing of the electrical activity of your heart.  A CT scanner will take pictures of your heart. During this time, you will be asked to lie still and hold your breath for 2-3 seconds while a picture of your heart is being taken. The procedure may vary among health care providers and hospitals. What happens after the procedure?  You can get dressed.  You can return to your normal activities.  It is up to you to get the results of your test. Ask your health care provider, or the department that is doing the test, when your results will be ready. Summary  A coronary calcium scan is an imaging test used to look for deposits of calcium and other fatty materials (plaques) in the inner lining of the blood vessels of the heart (coronary arteries).  Generally, this is a safe procedure. Tell your health care provider if you are pregnant or may be pregnant.  No preparation is needed for this procedure.  A CT scanner will take pictures of your heart.  You can return to your normal activities after the scan is done. This information is not intended to replace advice  given to you by your health care provider. Make sure you discuss any questions you have with your health care provider. Document Released: 01/10/2008 Document Revised: 06/02/2016 Document Reviewed: 06/02/2016 Elsevier Interactive Patient Education  2017 [employer].     Follow-Up: At [facility], you and your health needs are our priority.  As part of our continuing mission to provide you with exceptional heart care, we have created designated  Provider Care Teams.  These Care Teams include your primary Cardiologist (physician) and Advanced Practice Providers (APPs -  Physician Assistants and Nurse Practitioners) who all work together to provide you with the care you need, when you need it.  We recommend signing up for the patient portal called "MyChart".  Sign up information is provided on this After Visit Summary.  MyChart is used to connect with patients for Virtual Visits (Telemedicine).  Patients are able to view lab/test results, encounter notes, upcoming appointments, etc.  Non-urgent messages can be sent to your provider as well.   To learn more about what you can do with MyChart, go to [email or web address].    Your next appointment:   4 month(s) - LIPID CLINIC  The format for your next appointment:   In Person  Provider:   Kirtland Bouchard [place] Hilty, MD

## 2020-11-02 NOTE — Progress Notes (Signed)
LIPID CLINIC CONSULT NOTE  Chief Complaint:  Manage dyslipidemia  Primary Care Physician: Sheliah Hatch, MD  Primary Cardiologist:  No primary care provider on file.  HPI:  Charlene Wise is a 60 y.o. female who is being seen today for the evaluation of anemia at the request of Marcelle Overlie, MD.  This is a pleasant 60 year old female kindly referred by Dr. Vincente Poli for evaluation and management of dyslipidemia.  She had recent lipids in December showing total cholesterol 285, HDL 55, triglycerides 196 and LDL of 193.  She reports family history of high cholesterol both in her sister and her mom but not necessarily early onset heart disease.  Her cholesterol is concerning for possible genetic dyslipidemia.  She does report she has been under a bit of stress and is hoping to retire soon.  She is fairly sedentary and is making some dietary changes as well.  She has not previously tried statin therapy and is somewhat reluctant to be on treatment long-term.  She does take supplemental progesterone and estrogen topical and is on some thyroid medication as well with recent normal thyroid levels.  PMHx:  No past medical history on file.  Past Surgical History:  Procedure Laterality Date  . COLONOSCOPY     age 51    FAMHx:  No family history on file.  Mother and sister with dyslipidemia  SOCHx:   reports that she has never smoked. She has never used smokeless tobacco. She reports current alcohol use. She reports that she does not use drugs.  ALLERGIES:  No Known Allergies  ROS: Pertinent items noted in HPI and remainder of comprehensive ROS otherwise negative.  HOME MEDS: Current Outpatient Medications on File Prior to Visit  Medication Sig Dispense Refill  . ALPRAZolam (XANAX) 0.5 MG tablet Take 0.5 mg by mouth 2 (two) times daily as needed.    . clobetasol (TEMOVATE) 0.05 % external solution as needed.    . Estradiol (ESTROGEL) 0.75 MG/1.25 GM (0.06%) topical gel  daily.    Marland Kitchen OMEPRAZOLE PO Take 10 mg by mouth daily.    . progesterone (PROMETRIUM) 100 MG capsule Take 100 mg by mouth daily.    Marland Kitchen thyroid (ARMOUR) 30 MG tablet Take 30 mg by mouth daily.     No current facility-administered medications on file prior to visit.    LABS/IMAGING: No results found for this or any previous visit (from the past 48 hour(s)). No results found.  LIPID PANEL: No results found for: CHOL, TRIG, HDL, CHOLHDL, VLDL, LDLCALC, LDLDIRECT  WEIGHTS: Wt Readings from Last 3 Encounters:  11/02/20 183 lb 3.2 oz (83.1 kg)  05/16/15 148 lb 4 oz (67.2 kg)  12/26/14 148 lb (67.1 kg)    VITALS: BP (!) 162/94 (BP Location: Left Arm, Patient Position: Sitting)   Pulse 82   Ht 5\' 6"  (1.676 m)   Wt 183 lb 3.2 oz (83.1 kg)   SpO2 97%   BMI 29.57 kg/m   EXAM: General appearance: alert and mild distress Neck: no carotid bruit, no JVD and thyroid not enlarged, symmetric, no tenderness/mass/nodules Lungs: clear to auscultation bilaterally Heart: regular rate and rhythm, S1, S2 normal, no murmur, click, rub or gallop Abdomen: soft, non-tender; bowel sounds normal; no masses,  no organomegaly Extremities: extremities normal, atraumatic, no cyanosis or edema Pulses: 2+ and symmetric Skin: Skin color, texture, turgor normal. No rashes or lesions Neurologic: Grossly normal Psych: Pleasant  EKG: Deferred  ASSESSMENT: 1. Mixed dyslipidemia with high LDL cholesterol,  possible FH 2. Family history of high cholesterol in mother and sister  PLAN: 1.   Ms. Bujak has a mixed dyslipidemia with high LDL cholesterol.  This could be a familial hyperlipidemia although she does report some atherogenic diet and less physical activity.  She hopes this will improve with retirement in the near future.  There is family history of high cholesterol suggesting of a familial dyslipidemia.  We discussed the possibility of genetic testing but she wished to defer on that.  We also talked  about calcium scoring which could help [place] to better risk stratify her.  She did seem interested in that and will start there.  Based on her findings she may be amenable to therapy and we could better assess what her goals would be.  Thanks as always for the kind referral.  Chrystie Nose, MD, [facility] Loll  [place]  [facility]  Medical Director of the Advanced Lipid Disorders &  Cardiovascular Risk Reduction Clinic Diplomate of the American Board of Clinical Lipidology Attending Cardiologist  Direct Dial: [phone]  Fax: [phone]  Website:  [email or web address] 11/02/2020, 1:42 PM

## 2020-12-13 ENCOUNTER — Ambulatory Visit (INDEPENDENT_AMBULATORY_CARE_PROVIDER_SITE_OTHER)
Admission: RE | Admit: 2020-12-13 | Discharge: 2020-12-13 | Disposition: A | Discharge status: Home / Self Care | Payer: Self-pay | Source: Ambulatory Visit | Attending: Internal Medicine | Admitting: Internal Medicine

## 2020-12-13 ENCOUNTER — Other Ambulatory Visit: Payer: Self-pay

## 2020-12-13 DIAGNOSIS — E7800 Pure hypercholesterolemia, unspecified: Secondary | ICD-10-CM

## 2020-12-26 ENCOUNTER — Other Ambulatory Visit: Payer: Self-pay

## 2020-12-26 ENCOUNTER — Encounter: Payer: Self-pay | Admitting: Internal Medicine

## 2020-12-26 ENCOUNTER — Ambulatory Visit (INDEPENDENT_AMBULATORY_CARE_PROVIDER_SITE_OTHER): Payer: 59 | Admitting: Internal Medicine

## 2020-12-26 VITALS — BP 130/82 | HR 90 | Ht 66.0 in | Wt 175.4 lb

## 2020-12-26 DIAGNOSIS — I2510 Atherosclerotic heart disease of native coronary artery without angina pectoris: Secondary | ICD-10-CM

## 2020-12-26 DIAGNOSIS — E7800 Pure hypercholesterolemia, unspecified: Secondary | ICD-10-CM | POA: Diagnosis not present

## 2020-12-26 DIAGNOSIS — R911 Solitary pulmonary nodule: Secondary | ICD-10-CM

## 2020-12-26 DIAGNOSIS — I2584 Coronary atherosclerosis due to calcified coronary lesion: Secondary | ICD-10-CM

## 2020-12-26 DIAGNOSIS — K760 Fatty (change of) liver, not elsewhere classified: Secondary | ICD-10-CM | POA: Diagnosis not present

## 2020-12-26 NOTE — Progress Notes (Signed)
LIPID CLINIC CONSULT NOTE  Chief Complaint:  Manage dyslipidemia  Primary Care Physician: Sheliah Hatch, MD  Primary Cardiologist:  None  HPI:  Charlene Wise is a 60 y.o. female who is being seen today for the evaluation of anemia at the request of Tabori, Helane Rima, MD.  This is a pleasant 60 year old female kindly referred by Dr. Vincente Poli for evaluation and management of dyslipidemia.  She had recent lipids in December showing total cholesterol 285, HDL 55, triglycerides 196 and LDL of 193.  She reports family history of high cholesterol both in her sister and her mom but not necessarily early onset heart disease.  Her cholesterol is concerning for possible genetic dyslipidemia.  She does report she has been under a bit of stress and is hoping to retire soon.  She is fairly sedentary and is making some dietary changes as well.  She has not previously tried statin therapy and is somewhat reluctant to be on treatment long-term.  She does take supplemental progesterone and estrogen topical and is on some thyroid medication as well with recent normal thyroid levels.  12/26/2020  Ms. Runion is today for follow-up.  Scoring in May which demonstrated a score of 1, 61st percentile for age and sex matched control.  Moreover, the radiology over read demonstrated aortic atherosclerosis, a small right middle lobe nodule and severe hepatic steatosis.  I shared this with her today.  We will plan a repeat CT to evaluate that nodule to make sure it stable in about a year.  The liver fat is highly suggestive of a metabolic dyslipidemia.  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  I would recommend lipid-lowering therapies.  PMHx:  No past medical history on file.  Past Surgical History:  Procedure Laterality Date  . COLONOSCOPY     age 65    FAMHx:  No family history on file.  Mother and sister  with dyslipidemia  SOCHx:   reports that she has never smoked. She has never used smokeless tobacco. She reports current alcohol use. She reports that she does not use drugs.  ALLERGIES:  No Known Allergies  ROS: Pertinent items noted in HPI and remainder of comprehensive ROS otherwise negative.  HOME MEDS: Current Outpatient Medications on File Prior to Visit  Medication Sig Dispense Refill  . ALPRAZolam (XANAX) 0.5 MG tablet Take 0.5 mg by mouth 2 (two) times daily as needed.    . clobetasol (TEMOVATE) 0.05 % external solution as needed.    Marland Kitchen OMEPRAZOLE PO Take 10 mg by mouth daily.    Marland Kitchen thyroid (ARMOUR) 30 MG tablet Take 30 mg by mouth daily.     No current facility-administered medications on file prior to visit.    LABS/IMAGING: No results found for this or any previous visit (from the past 48 hour(s)). No results found.  LIPID PANEL: No results found for: CHOL, TRIG, HDL, CHOLHDL, VLDL, LDLCALC, LDLDIRECT  WEIGHTS: Wt Readings from Last 3 Encounters:  12/26/20 175 lb 6.4 oz (79.6 kg)  11/02/20 183 lb 3.2 oz (83.1 kg)  05/16/15 148 lb 4 oz (67.2 kg)    VITALS: BP 130/82 (BP Location: Left Arm, Patient Position: Sitting, Cuff Size: Normal)   Pulse 90   Ht 5\' 6"  (1.676 m)   Wt 175 lb 6.4 oz (79.6 kg)   BMI 28.31 kg/m   EXAM: Deferred  EKG: Deferred  ASSESSMENT: 1. Mixed  dyslipidemia with high LDL cholesterol, possible FH 2. Family history of high cholesterol in mother and sister 81. CAC score of 1, 61st percentile (11/2020) 4. Severe hepatic steatosis 5. Aortic atherosclerosis.  PLAN: 1.   Ms. Clemence had a very high LDL cholesterol with family history of high cholesterol and does have some minimal coronary disease based on CT however abnormal given her age.  Additionally she has severe hepatic steatosis and aortic atherosclerosis.  This will need to be minded if we consider starting statin therapy as there is a likelihood for liver enzyme elevation.  I  did discuss treatment options with her today.  Consider therapy options more and work more aggressively on diet and exercise.  We will plan repeat lab work in 6 months and follow-up at that time.  Chrystie Nose, MD, [facility], FACP  [place]  [facility]  Medical Director of the Advanced Lipid Disorders &  Cardiovascular Risk Reduction Clinic Diplomate of the American Board of Clinical Lipidology Attending Cardiologist   Direct Dial: [phone]  Fax: [phone]  Website:  [email or web address] 12/26/2020, 2:24 PM

## 2020-12-26 NOTE — Patient Instructions (Signed)
Medication Instructions:  Your physician recommends that you continue on your current medications as directed. Please refer to the Current Medication list given to you today.  *If you need a refill on your cardiac medications before your next appointment, please call your pharmacy*   Lab Work: FASTING lab work in 6 months  -- complete about 1 week before your next visit with Dr. Rennis Golden   If you have labs (blood work) drawn today and your tests are completely normal, you will receive your results only by: Marland Kitchen MyChart Message (if you have MyChart) OR . A paper copy in the mail If you have any lab test that is abnormal or we need to change your treatment, we will call you to review the results.   Testing/Procedures: Chest CT in May 2023   Follow-Up: At [facility], you and your health needs are our priority.  As part of our continuing mission to provide you with exceptional heart care, we have created designated Provider Care Teams.  These Care Teams include your primary Cardiologist (physician) and Advanced Practice Providers (APPs -  Physician Assistants and Nurse Practitioners) who all work together to provide you with the care you need, when you need it.  We recommend signing up for the patient portal called "MyChart".  Sign up information is provided on this After Visit Summary.  MyChart is used to connect with patients for Virtual Visits (Telemedicine).  Patients are able to view lab/test results, encounter notes, upcoming appointments, etc.  Non-urgent messages can be sent to your provider as well.   To learn more about what you can do with MyChart, go to [email or web address].    Your next appointment:   6 month(s) - lipid clinic  The format for your next appointment:   In Person  Provider:   K. [place] Hilty, MD   Other Instructions

## 2021-03-06 ENCOUNTER — Ambulatory Visit: Payer: 59 | Admitting: Internal Medicine

## 2021-09-11 DIAGNOSIS — Z6829 Body mass index (BMI) 29.0-29.9, adult: Secondary | ICD-10-CM | POA: Diagnosis not present

## 2021-09-11 DIAGNOSIS — Z01419 Encounter for gynecological examination (general) (routine) without abnormal findings: Secondary | ICD-10-CM | POA: Diagnosis not present

## 2021-09-11 DIAGNOSIS — Z1231 Encounter for screening mammogram for malignant neoplasm of breast: Secondary | ICD-10-CM | POA: Diagnosis not present

## 2021-09-11 DIAGNOSIS — Z124 Encounter for screening for malignant neoplasm of cervix: Secondary | ICD-10-CM | POA: Diagnosis not present

## 2021-09-18 DIAGNOSIS — Z13220 Encounter for screening for lipoid disorders: Secondary | ICD-10-CM | POA: Diagnosis not present

## 2021-09-18 DIAGNOSIS — Z130 Encounter for screening for diseases of the blood and blood-forming organs and certain disorders involving the immune mechanism: Secondary | ICD-10-CM | POA: Diagnosis not present

## 2021-09-18 DIAGNOSIS — Z131 Encounter for screening for diabetes mellitus: Secondary | ICD-10-CM | POA: Diagnosis not present

## 2021-09-18 DIAGNOSIS — Z13228 Encounter for screening for other metabolic disorders: Secondary | ICD-10-CM | POA: Diagnosis not present

## 2021-09-18 DIAGNOSIS — Z1329 Encounter for screening for other suspected endocrine disorder: Secondary | ICD-10-CM | POA: Diagnosis not present

## 2022-02-16 IMAGING — CT CT CARDIAC CORONARY ARTERY CALCIUM SCORE
1 series · 1 of 2 positions shown · non-contrast
Comparison: None.
COMPARISON: None.

Addendum:
EXAM:
OVER-READ INTERPRETATION  CT CHEST

The following report is an over-read performed by radiologist Dr.
Silvestre Hoa [REDACTED] on 12/13/2020. This
over-read does not include interpretation of cardiac or coronary
anatomy or pathology. The coronary calcium score interpretation by
the cardiologist is attached.
CLINICAL DATA: Cardiovascular Disease Risk stratification
Coronary Calcium Score
TECHNIQUE: A gated, non-contrast computed tomography scan of the heart was
performed using 3mm slice thickness. Axial images were analyzed on a
dedicated workstation. Calcium scoring of the coronary arteries was
performed using the Agatston method.

[Series 956: — · 0.41mm/px · 1 of 2 slices shown]
[im 2/2]
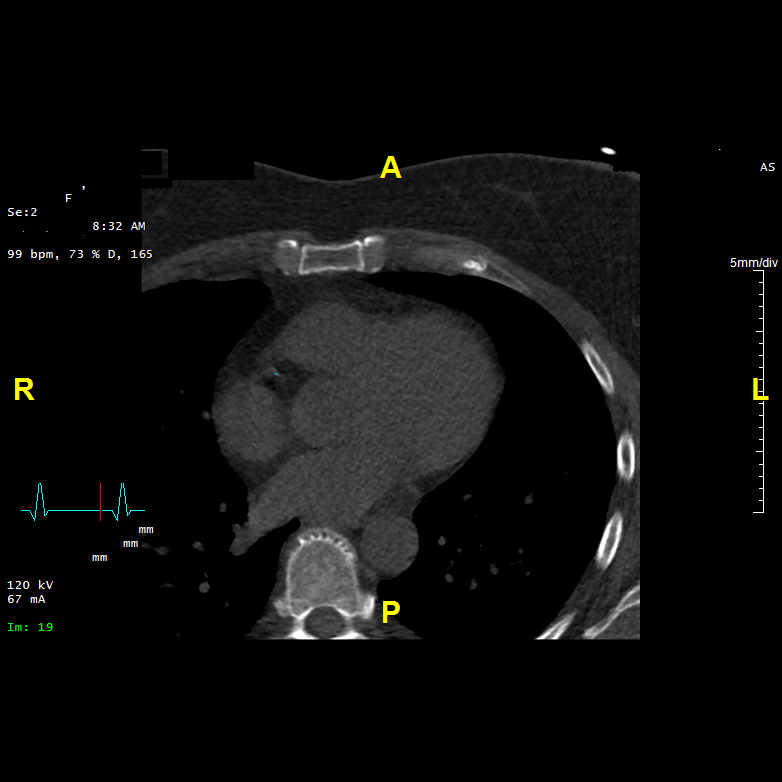

[1 of 2 positions shown; findings below may reference images not displayed]

FINDINGS: Atherosclerotic calcifications in the thoracic aorta. 2 mm right
middle lobe pulmonary nodule (axial image 23 of series 3). Within
the visualized portions of the thorax there are no other larger more
suspicious appearing pulmonary nodules or masses, there is no acute
consolidative airspace disease, no pleural effusions, no
pneumothorax and no lymphadenopathy. Visualized portions of the
upper abdomen demonstrates diffuse low attenuation throughout the
visualized hepatic parenchyma, indicative of severe hepatic
steatosis. There are no aggressive appearing lytic or blastic
lesions noted in the visualized portions of the skeleton.
IMPRESSION: 1.  Aortic Atherosclerosis ([id number]).
2. 2 mm right middle lobe pulmonary nodule, nonspecific, but
statistically likely benign. No follow-up needed if patient is
low-risk. Non-contrast chest CT can be considered in 12 months if
patient is high-risk. This recommendation follows the consensus
statement: Guidelines for Management of Incidental Pulmonary Nodules
Detected on CT Images: From the [HOSPITAL] 7718; Radiology
3. Severe hepatic steatosis.
FINDINGS: Coronary arteries: Normal origins.

Coronary Calcium Score:

Left main: 0

Left anterior descending artery: 0

Left circumflex artery: 0

Right coronary artery: 1

Total: 1

Percentile: 61

Pericardium: Normal.

Ascending Aorta: Normal caliber. Descending aorta small focal aortic
plaque.

Non-cardiac: See separate report from [REDACTED].
IMPRESSION: Coronary calcium score of 1. This was 61 percentile for age-, race-,
and sex-matched controls.

Small focal aortic plaque, descending.



If CAC=0, it is reasonable to withhold statin therapy and reassess
in 5 to 10 years, as long as higher risk conditions are absent
(diabetes mellitus, family history of premature CHD in first degree
relatives (males <55 years; females <65 years), cigarette smoking,
or LDL >=190 mg/dL).

If CAC is 1 to 99, it is reasonable to initiate statin therapy for
patients >=55 years of age.

If CAC is >=100 or >=75th percentile, it is reasonable to initiate
statin therapy at any age.

Cardiology referral should be considered for patients with CAC
scores >=400 or >=75th percentile.

*2319 AHA/ACC/AACVPR/AAPA/ABC/ARABORI/CHING/ASDASDASDSA/Favio Andres/AMAZIGH/MILE/BLAIN
Guideline on the Management of Blood Cholesterol: A Report of the
American College of Cardiology/American Heart Association Task Force
on Clinical Practice Guidelines. J Am Coll Cardiol.
5090;73(24):7392-7762.

*** End of Addendum ***
EXAM:
OVER-READ INTERPRETATION  CT CHEST

The following report is an over-read performed by radiologist Dr.
Silvestre Hoa [REDACTED] on 12/13/2020. This
over-read does not include interpretation of cardiac or coronary
anatomy or pathology. The coronary calcium score interpretation by
the cardiologist is attached.
FINDINGS: Atherosclerotic calcifications in the thoracic aorta. 2 mm right
middle lobe pulmonary nodule (axial image 23 of series 3). Within
the visualized portions of the thorax there are no other larger more
suspicious appearing pulmonary nodules or masses, there is no acute
consolidative airspace disease, no pleural effusions, no
pneumothorax and no lymphadenopathy. Visualized portions of the
upper abdomen demonstrates diffuse low attenuation throughout the
visualized hepatic parenchyma, indicative of severe hepatic
steatosis. There are no aggressive appearing lytic or blastic
lesions noted in the visualized portions of the skeleton.
IMPRESSION: 1.  Aortic Atherosclerosis ([id number]).
2. 2 mm right middle lobe pulmonary nodule, nonspecific, but
statistically likely benign. No follow-up needed if patient is
low-risk. Non-contrast chest CT can be considered in 12 months if
patient is high-risk. This recommendation follows the consensus
statement: Guidelines for Management of Incidental Pulmonary Nodules
Detected on CT Images: From the [HOSPITAL] 7718; Radiology
3. Severe hepatic steatosis.

## 2022-03-07 DIAGNOSIS — H5213 Myopia, bilateral: Secondary | ICD-10-CM | POA: Diagnosis not present

## 2022-03-07 DIAGNOSIS — H524 Presbyopia: Secondary | ICD-10-CM | POA: Diagnosis not present

## 2022-03-07 DIAGNOSIS — H2513 Age-related nuclear cataract, bilateral: Secondary | ICD-10-CM | POA: Diagnosis not present

## 2022-03-07 DIAGNOSIS — H04123 Dry eye syndrome of bilateral lacrimal glands: Secondary | ICD-10-CM | POA: Diagnosis not present

## 2022-03-07 DIAGNOSIS — H52223 Regular astigmatism, bilateral: Secondary | ICD-10-CM | POA: Diagnosis not present

## 2022-09-08 DIAGNOSIS — L570 Actinic keratosis: Secondary | ICD-10-CM | POA: Diagnosis not present

## 2022-09-08 DIAGNOSIS — D045 Carcinoma in situ of skin of trunk: Secondary | ICD-10-CM | POA: Diagnosis not present

## 2022-09-08 DIAGNOSIS — D485 Neoplasm of uncertain behavior of skin: Secondary | ICD-10-CM | POA: Diagnosis not present

## 2023-07-05 DIAGNOSIS — R300 Dysuria: Secondary | ICD-10-CM | POA: Diagnosis not present

## 2023-10-24 ENCOUNTER — Emergency Department (HOSPITAL_BASED_OUTPATIENT_CLINIC_OR_DEPARTMENT_OTHER)

## 2023-10-24 ENCOUNTER — Emergency Department (HOSPITAL_BASED_OUTPATIENT_CLINIC_OR_DEPARTMENT_OTHER)
Admission: EM | Admit: 2023-10-24 | Discharge: 2023-10-24 | Disposition: A | Discharge status: Home / Self Care | Attending: Emergency Medicine | Admitting: Emergency Medicine

## 2023-10-24 ENCOUNTER — Encounter (HOSPITAL_BASED_OUTPATIENT_CLINIC_OR_DEPARTMENT_OTHER): Payer: Self-pay | Admitting: Emergency Medicine

## 2023-10-24 ENCOUNTER — Other Ambulatory Visit: Payer: Self-pay

## 2023-10-24 DIAGNOSIS — R1033 Periumbilical pain: Secondary | ICD-10-CM | POA: Diagnosis not present

## 2023-10-24 DIAGNOSIS — K829 Disease of gallbladder, unspecified: Secondary | ICD-10-CM | POA: Diagnosis not present

## 2023-10-24 DIAGNOSIS — K5730 Diverticulosis of large intestine without perforation or abscess without bleeding: Secondary | ICD-10-CM | POA: Diagnosis not present

## 2023-10-24 DIAGNOSIS — K760 Fatty (change of) liver, not elsewhere classified: Secondary | ICD-10-CM | POA: Diagnosis not present

## 2023-10-24 DIAGNOSIS — R109 Unspecified abdominal pain: Secondary | ICD-10-CM | POA: Diagnosis not present

## 2023-10-24 DIAGNOSIS — R1011 Right upper quadrant pain: Secondary | ICD-10-CM | POA: Insufficient documentation

## 2023-10-24 LAB — CBC WITH DIFFERENTIAL/PLATELET
Abs Immature Granulocytes: 0.03 10*3/uL (ref 0.00–0.07)
Basophils Absolute: 0 10*3/uL (ref 0.0–0.1)
Basophils Relative: 0 %
Eosinophils Absolute: 0.1 10*3/uL (ref 0.0–0.5)
Eosinophils Relative: 1 %
HCT: 42.5 % (ref 36.0–46.0)
Hemoglobin: 14.4 g/dL (ref 12.0–15.0)
Immature Granulocytes: 0 %
Lymphocytes Relative: 30 %
Lymphs Abs: 3.1 10*3/uL (ref 0.7–4.0)
MCH: 31.3 pg (ref 26.0–34.0)
MCHC: 33.9 g/dL (ref 30.0–36.0)
MCV: 92.4 fL (ref 80.0–100.0)
Monocytes Absolute: 0.9 10*3/uL (ref 0.1–1.0)
Monocytes Relative: 9 %
Neutro Abs: 5.9 10*3/uL (ref 1.7–7.7)
Neutrophils Relative %: 60 %
Platelets: 303 10*3/uL (ref 150–400)
RBC: 4.6 MIL/uL (ref 3.87–5.11)
RDW: 12.3 % (ref 11.5–15.5)
WBC: 10.1 10*3/uL (ref 4.0–10.5)
nRBC: 0 % (ref 0.0–0.2)

## 2023-10-24 LAB — COMPREHENSIVE METABOLIC PANEL WITH GFR
ALT: 31 U/L (ref 0–44)
AST: 20 U/L (ref 15–41)
Albumin: 4.4 g/dL (ref 3.5–5.0)
Alkaline Phosphatase: 58 U/L (ref 38–126)
Anion gap: 12 (ref 5–15)
BUN: 15 mg/dL (ref 8–23)
CO2: 24 mmol/L (ref 22–32)
Calcium: 9.5 mg/dL (ref 8.9–10.3)
Chloride: 105 mmol/L (ref 98–111)
Creatinine, Ser: 0.64 mg/dL (ref 0.44–1.00)
GFR, Estimated: >60 mL/min (ref >60)
Glucose, Bld: 101 mg/dL — ABNORMAL HIGH (ref 70–99)
Potassium: 3.7 mmol/L (ref 3.5–5.1)
Sodium: 141 mmol/L (ref 135–145)
Total Bilirubin: 0.9 mg/dL (ref 0.0–1.2)
Total Protein: 7.5 g/dL (ref 6.5–8.1)

## 2023-10-24 LAB — URINALYSIS, MICROSCOPIC (REFLEX)

## 2023-10-24 LAB — URINALYSIS, ROUTINE W REFLEX MICROSCOPIC
Bilirubin Urine: NEGATIVE
Glucose, UA: NEGATIVE mg/dL
Hgb urine dipstick: NEGATIVE
Ketones, ur: NEGATIVE mg/dL
Nitrite: NEGATIVE
Protein, ur: NEGATIVE mg/dL
Specific Gravity, Urine: <=1.005 (ref 1.005–1.030)
pH: 6 (ref 5.0–8.0)

## 2023-10-24 LAB — LIPASE, BLOOD: Lipase: 34 U/L (ref 11–51)

## 2023-10-24 MED ORDER — MORPHINE SULFATE (PF) 4 MG/ML IV SOLN
4.0000 mg | Freq: Once | INTRAVENOUS | Status: AC
  Administered 2023-10-24: 4 mg via INTRAVENOUS
  Filled 2023-10-24: qty 1

## 2023-10-24 MED ORDER — IOHEXOL 300 MG/ML  SOLN
75.0000 mL | Freq: Once | INTRAMUSCULAR | Status: AC | PRN
  Administered 2023-10-24: 60 mL via INTRAVENOUS

## 2023-10-24 MED ORDER — SUCRALFATE 1 G PO TABS: 1.0000 g | ORAL_TABLET | Freq: Three times a day (TID) | ORAL | 0 refills | Status: AC

## 2023-10-24 MED ORDER — PANTOPRAZOLE SODIUM 20 MG PO TBEC: 20.0000 mg | DELAYED_RELEASE_TABLET | Freq: Every day | ORAL | 0 refills | Status: AC

## 2023-10-24 MED ORDER — SODIUM CHLORIDE 0.9 % IV BOLUS
1000.0000 mL | Freq: Once | INTRAVENOUS | Status: AC
  Administered 2023-10-24: 1000 mL via INTRAVENOUS

## 2023-10-24 MED ORDER — ONDANSETRON HCL 4 MG/2ML IJ SOLN
4.0000 mg | Freq: Once | INTRAMUSCULAR | Status: AC
  Administered 2023-10-24: 4 mg via INTRAVENOUS
  Filled 2023-10-24: qty 2

## 2023-10-24 MED ORDER — HYDROCODONE-ACETAMINOPHEN 5-325 MG PO TABS: 1.0000 | ORAL_TABLET | ORAL | 0 refills | Status: AC | PRN

## 2023-10-24 NOTE — ED Provider Notes (Addendum)
 [place] [facility] Provider Note   CSN: [id number] Arrival date & time: 10/24/23  1743     History  Chief Complaint  Patient presents with   Abdominal Pain    Charlene Wise is a 63 y.o. female.  Patient is a 63 year old female who presents with right side abdominal pain.  She said it started yesterday and has been constant since that time.  It radiates to her back as well.  She said it is not worse with movement.  Not worse with eating.  She denies any associated nausea vomiting or diarrhea.  She says her stool has changed from brown to clay colored.  No fevers.  No urinary symptoms.  No prior abdominal surgeries.       Home Medications Prior to Admission medications   Medication Sig Start Date End Date Taking? Authorizing Provider  HYDROcodone-acetaminophen (NORCO/VICODIN) 5-325 MG tablet Take 1-2 tablets by mouth every 4 (four) hours as needed. 10/24/23  Yes Rolan Bucco, MD  pantoprazole (PROTONIX) 20 MG tablet Take 1 tablet (20 mg total) by mouth daily. 10/24/23  Yes Rolan Bucco, MD  sucralfate (CARAFATE) 1 g tablet Take 1 tablet (1 g total) by mouth 4 (four) times daily -  with meals and at bedtime. 10/24/23  Yes Rolan Bucco, MD  ALPRAZolam Prudy Feeler) 0.5 MG tablet Take 0.5 mg by mouth 2 (two) times daily as needed. 08/16/20   [provider]  clobetasol (TEMOVATE) 0.05 % external solution as needed.    [provider]  thyroid (ARMOUR) 30 MG tablet Take 30 mg by mouth daily.    [provider]      Allergies    Patient has no known allergies.    Review of Systems   Review of Systems  Constitutional:  Negative for chills, diaphoresis, fatigue and fever.  HENT:  Negative for congestion, rhinorrhea and sneezing.   Eyes: Negative.   Respiratory:  Negative for cough, chest tightness and shortness of breath.   Cardiovascular:  Negative for chest pain and leg swelling.  Gastrointestinal:  Positive for  abdominal pain. Negative for blood in stool, diarrhea, nausea and vomiting.  Genitourinary:  Negative for difficulty urinating, flank pain, frequency and hematuria.  Musculoskeletal:  Negative for arthralgias and back pain.  Skin:  Negative for rash.  Neurological:  Negative for dizziness, speech difficulty, weakness, numbness and headaches.    Physical Exam Updated Vital Signs BP (!) 144/86   Pulse 80   Temp 98.1 F (36.7 C) (Oral)   Resp 15   Ht 5\' 6"  (1.676 m)   Wt 72.6 kg   SpO2 95%   BMI 25.82 kg/m  Physical Exam Constitutional:      Appearance: She is well-developed.  HENT:     Head: Normocephalic and atraumatic.  Eyes:     Pupils: Pupils are equal, round, and reactive to light.  Cardiovascular:     Rate and Rhythm: Normal rate and regular rhythm.     Heart sounds: Normal heart sounds.  Pulmonary:     Effort: Pulmonary effort is normal. No respiratory distress.     Breath sounds: Normal breath sounds. No wheezing or rales.  Chest:     Chest wall: No tenderness.  Abdominal:     General: Bowel sounds are normal.     Palpations: Abdomen is soft.     Tenderness: There is abdominal tenderness in the right upper quadrant and periumbilical area. There is no guarding or rebound.  Musculoskeletal:  General: Normal range of motion.     Cervical back: Normal range of motion and neck supple.  Lymphadenopathy:     Cervical: No cervical adenopathy.  Skin:    General: Skin is warm and dry.     Findings: No rash.  Neurological:     Mental Status: She is alert and oriented to person, place, and time.     ED Results / Procedures / Treatments   Labs (all labs ordered are listed, but only abnormal results are displayed) Labs Reviewed  COMPREHENSIVE METABOLIC PANEL WITH GFR - Abnormal; Notable for the following components:      Result Value   Glucose, Bld 101 (*)    All other components within normal limits  URINALYSIS, ROUTINE W REFLEX MICROSCOPIC - Abnormal;  Notable for the following components:   Leukocytes,Ua TRACE (*)    All other components within normal limits  URINALYSIS, MICROSCOPIC (REFLEX) - Abnormal; Notable for the following components:   Bacteria, UA FEW (*)    All other components within normal limits  CBC WITH DIFFERENTIAL/PLATELET  LIPASE, BLOOD    EKG None  Radiology CT ABDOMEN PELVIS W CONTRAST Result Date: 10/24/2023 CLINICAL DATA:  Right-sided abdominal pain. EXAM: CT ABDOMEN AND PELVIS WITH CONTRAST TECHNIQUE: Multidetector CT imaging of the abdomen and pelvis was performed using the standard protocol following bolus administration of intravenous contrast. RADIATION DOSE REDUCTION: This exam was performed according to the departmental dose-optimization program which includes automated exposure control, adjustment of the mA and/or kV according to patient size and/or use of iterative reconstruction technique. CONTRAST:  60mL OMNIPAQUE IOHEXOL 300 MG/ML  SOLN COMPARISON:  Right upper quadrant ultrasound earlier today. FINDINGS: Lower chest: Mild dependent atelectasis.  No pleural effusion. Hepatobiliary: Mild diffuse hepatic steatosis. No suspicious liver lesion. Gallbladder physiologically distended, no calcified stone. No biliary dilatation. Pancreas: Unremarkable. No pancreatic ductal dilatation or surrounding inflammatory changes. Spleen: Normal in size without focal abnormality. Adrenals/Urinary Tract: Normal adrenal glands. Punctate calcification in the right kidney may be associated with the cortical cyst. No perinephric inflammation. Homogeneous enhancement with symmetric excretion on delayed phase imaging. Unremarkable urinary bladder. Stomach/Bowel: The low lying cecum in the deep pelvis. There is no bowel obstruction or inflammation. Scattered colonic diverticula without diverticulitis. Small to moderate volume of colonic stool. Vascular/Lymphatic: Aortic atherosclerosis. No aneurysm. Portal vein is patent. No abdominopelvic  adenopathy. Reproductive: Uterus and bilateral adnexa are unremarkable. Other: No free air, free fluid, or intra-abdominal fluid collection. No abdominal wall or inguinal hernia. Musculoskeletal: There are no acute or suspicious osseous abnormalities. IMPRESSION: 1. No acute abnormality in the abdomen/pelvis. 2. Mild hepatic steatosis. 3. Colonic diverticulosis without diverticulitis. Aortic Atherosclerosis (ICD10-I70.0). Electronically Signed   By: Narda Rutherford M.D.   On: 10/24/2023 21:04   US Abdomen Limited RUQ (LIVER/GB) Result Date: 10/24/2023 CLINICAL DATA:  Right upper quadrant pain EXAM: ULTRASOUND ABDOMEN LIMITED RIGHT UPPER QUADRANT COMPARISON:  None Available. FINDINGS: Gallbladder: No visible stones, wall thickening or pericholecystic fluid. The patient was tender over the gallbladder during the study. Common bile duct: Diameter: Normal caliber, 5-6 mm. Liver: Increased echotexture compatible with fatty infiltration. No focal abnormality or biliary ductal dilatation. Hypoechoic area near the gallbladder fossa most likely reflects area of focal fatty sparing. Portal vein is patent on color Doppler imaging with normal direction of blood flow towards the liver. Other: None. IMPRESSION: Normal appearing gallbladder. The patient was tender over the gallbladder during the study. Hepatic steatosis. Electronically Signed   By: Charlett Nose M.D.  On: 10/24/2023 19:01    Procedures Procedures    Medications Ordered in ED Medications  morphine (PF) 4 MG/ML injection 4 mg (4 mg Intravenous Given 10/24/23 1850)  ondansetron (ZOFRAN) injection 4 mg (4 mg Intravenous Given 10/24/23 1850)  sodium chloride 0.9 % bolus 1,000 mL (0 mLs Intravenous Stopped 10/24/23 1950)  iohexol (OMNIPAQUE) 300 MG/ML solution 75 mL (60 mLs Intravenous Contrast Given 10/24/23 2046)  morphine (PF) 4 MG/ML injection 4 mg (4 mg Intravenous Given 10/24/23 2104)    ED Course/ Medical Decision Making/ A&P                                  Medical Decision Making Amount and/or Complexity of Data Reviewed Radiology: ordered.  Risk Prescription drug management.   Patient is a 63 year old who presents with pain in her right upper abdomen and epigastric area.  She has some pain in her right mid abdomen as well.  Labs reviewed and are nonconcerning.  No evidence of pancreatitis.  Her liver enzymes are normal.  Initially had a right upper quadrant ultrasound which did not show any evidence of cholecystitis or gallstones.  However she did have some tenderness on palpation of her gallbladder on the ultrasound.  Follow-up CT scan did not show any other acute abnormalities.  No evidence of appendicitis or colitis.  She was treated here with antiemetics and pain medication.  She is overall feeling better.  She was discharged home in good condition.  I encouraged her to follow-up with her primary care doctor.  She may need some further evaluation of her gallbladder via HIDA scan.  She was given prescriptions for Protonix, Carafate and a small course of hydrocodone.  Return precautions were given.  Her urine had trace leukocyte esterase and few bacteria but she does not have any urinary symptoms.  Doubt that this represents infection.  Final Clinical Impression(s) / ED Diagnoses Final diagnoses:  Right upper quadrant abdominal pain    Rx / DC Orders ED Discharge Orders          Ordered    HYDROcodone-acetaminophen (NORCO/VICODIN) 5-325 MG tablet  Every 4 hours PRN        10/24/23 2239    pantoprazole (PROTONIX) 20 MG tablet  Daily        10/24/23 2239    sucralfate (CARAFATE) 1 g tablet  3 times daily with meals & bedtime        10/24/23 2239              Rolan Bucco, MD 10/24/23 0981    Rolan Bucco, MD 10/24/23 2243

## 2023-10-24 NOTE — Discharge Instructions (Addendum)
 Avoid any a fat containing foods which can make gallbladder pain worse.  Follow-up with your primary care doctor for recheck.  If your pain continues, you may need a HIDA scan or further evaluation of your gallbladder.  Return to the emergency room if you have any worsening symptoms.

## 2023-10-24 NOTE — ED Triage Notes (Signed)
 Pt with RUQ pain since yesterday, radiates to the middle of back; denies NVD; feels bloated
